# Patient Record
Sex: Male | Born: 1965 | Race: White | Hispanic: No | Marital: Married | Smoking: Current every day smoker
Health system: Southern US, Community
[De-identification: ages and names within clinical notes are randomized; demographics above are authoritative.]

---

## 2003-12-26 ENCOUNTER — Emergency Department: Payer: Self-pay | Admitting: Emergency Medicine

## 2008-03-14 ENCOUNTER — Emergency Department (HOSPITAL_COMMUNITY): Admission: EM | Admit: 2008-03-14 | Discharge: 2008-03-14 | Payer: Self-pay | Admitting: Emergency Medicine

## 2008-03-16 ENCOUNTER — Ambulatory Visit (HOSPITAL_COMMUNITY): Admission: RE | Admit: 2008-03-16 | Discharge: 2008-03-16 | Payer: Self-pay | Admitting: Emergency Medicine

## 2008-04-30 ENCOUNTER — Encounter: Admission: RE | Admit: 2008-04-30 | Discharge: 2008-04-30 | Payer: Self-pay | Admitting: Neurosurgery

## 2008-05-05 ENCOUNTER — Ambulatory Visit: Payer: Self-pay | Admitting: Internal Medicine

## 2008-05-15 ENCOUNTER — Emergency Department: Payer: Self-pay | Admitting: Emergency Medicine

## 2008-05-15 ENCOUNTER — Ambulatory Visit: Payer: Self-pay | Admitting: Internal Medicine

## 2008-06-24 ENCOUNTER — Ambulatory Visit: Payer: Self-pay | Admitting: Specialist

## 2008-07-08 ENCOUNTER — Ambulatory Visit: Payer: Self-pay | Admitting: Family

## 2008-07-25 ENCOUNTER — Ambulatory Visit: Payer: Self-pay | Admitting: Family

## 2008-10-22 ENCOUNTER — Ambulatory Visit: Payer: Self-pay | Admitting: Family

## 2008-11-06 ENCOUNTER — Ambulatory Visit: Payer: Self-pay | Admitting: Gastroenterology

## 2008-11-07 ENCOUNTER — Emergency Department: Payer: Self-pay | Admitting: Emergency Medicine

## 2008-12-26 ENCOUNTER — Emergency Department: Payer: Self-pay | Admitting: Emergency Medicine

## 2009-01-09 ENCOUNTER — Emergency Department: Payer: Self-pay | Admitting: Unknown Physician Specialty

## 2009-03-27 ENCOUNTER — Emergency Department: Payer: Self-pay | Admitting: Emergency Medicine

## 2009-09-17 ENCOUNTER — Ambulatory Visit: Payer: Self-pay | Admitting: Internal Medicine

## 2009-09-17 ENCOUNTER — Emergency Department: Payer: Self-pay | Admitting: Emergency Medicine

## 2009-11-05 ENCOUNTER — Emergency Department: Payer: Self-pay | Admitting: Emergency Medicine

## 2009-11-11 ENCOUNTER — Emergency Department: Payer: Self-pay | Admitting: Emergency Medicine

## 2009-12-06 ENCOUNTER — Emergency Department: Payer: Self-pay | Admitting: Emergency Medicine

## 2009-12-17 ENCOUNTER — Ambulatory Visit: Payer: Self-pay | Admitting: Internal Medicine

## 2010-02-26 ENCOUNTER — Ambulatory Visit: Payer: Self-pay | Admitting: Internal Medicine

## 2010-03-11 ENCOUNTER — Other Ambulatory Visit: Payer: Self-pay | Admitting: Internal Medicine

## 2010-05-08 ENCOUNTER — Ambulatory Visit: Payer: Self-pay | Admitting: Internal Medicine

## 2010-05-22 ENCOUNTER — Emergency Department: Payer: Self-pay | Admitting: Internal Medicine

## 2010-06-12 ENCOUNTER — Ambulatory Visit: Payer: Self-pay | Admitting: Internal Medicine

## 2010-06-23 ENCOUNTER — Ambulatory Visit: Payer: Self-pay | Admitting: Internal Medicine

## 2010-07-01 ENCOUNTER — Encounter (HOSPITAL_COMMUNITY)
Admission: RE | Admit: 2010-07-01 | Discharge: 2010-07-01 | Disposition: A | Discharge status: Home / Self Care | Payer: Medicaid Other | Source: Ambulatory Visit | Attending: Neurosurgery | Admitting: Neurosurgery

## 2010-07-01 LAB — COMPREHENSIVE METABOLIC PANEL
BUN: 6 mg/dL (ref 6–23)
CO2: 26 mEq/L (ref 19–32)
Calcium: 9.7 mg/dL (ref 8.4–10.5)
Chloride: 106 mEq/L (ref 96–112)
Creatinine, Ser: 0.89 mg/dL (ref 0.4–1.5)
GFR calc non Af Amer: >60 mL/min (ref >60)
Glucose, Bld: 96 mg/dL (ref 70–99)
Total Bilirubin: 0.5 mg/dL (ref 0.3–1.2)

## 2010-07-01 LAB — SURGICAL PCR SCREEN
MRSA, PCR: NEGATIVE
Staphylococcus aureus: NEGATIVE

## 2010-07-01 LAB — CBC
Hemoglobin: 15.4 g/dL (ref 13.0–17.0)
MCH: 32.8 pg (ref 26.0–34.0)
MCHC: 34.5 g/dL (ref 30.0–36.0)
MCV: 95.3 fL (ref 78.0–100.0)
RBC: 4.69 MIL/uL (ref 4.22–5.81)

## 2010-07-03 ENCOUNTER — Observation Stay (HOSPITAL_COMMUNITY): Payer: Medicaid Other

## 2010-07-03 ENCOUNTER — Observation Stay (HOSPITAL_COMMUNITY)
Admission: RE | Admit: 2010-07-03 | Discharge: 2010-07-04 | Disposition: A | Discharge status: Home / Self Care | Payer: Medicaid Other | Source: Ambulatory Visit | Attending: Neurosurgery | Admitting: Neurosurgery

## 2010-07-03 DIAGNOSIS — K219 Gastro-esophageal reflux disease without esophagitis: Secondary | ICD-10-CM | POA: Insufficient documentation

## 2010-07-03 DIAGNOSIS — F17200 Nicotine dependence, unspecified, uncomplicated: Secondary | ICD-10-CM | POA: Insufficient documentation

## 2010-07-03 DIAGNOSIS — M5020 Other cervical disc displacement, unspecified cervical region: Principal | ICD-10-CM | POA: Insufficient documentation

## 2010-07-03 DIAGNOSIS — Z01812 Encounter for preprocedural laboratory examination: Secondary | ICD-10-CM | POA: Insufficient documentation

## 2010-07-07 NOTE — Op Note (Signed)
Ricardo Butler, Ricardo Butler NO.:  [id number]  MEDICAL RECORD NO.:  [id number]           PATIENT TYPE:  O  LOCATION:  3534                         FACILITY:  [facility]  PHYSICIAN:  Reinaldo Meeker, M.D. DATE OF BIRTH:  05/29/1965  DATE OF PROCEDURE:  07/03/2010 DATE OF DISCHARGE:                              OPERATIVE REPORT   PREOPERATIVE DIAGNOSIS:  Herniated disk, C3-4, right.  POSTOPERATIVE DIAGNOSIS:  Herniated disk, C3-4, right.  PROCEDURE:  C3-4 anterior cervical diskectomy with trabecular metal interbody fusion, followed by Trinica anterior cervical plating.  SURGEON:  Reinaldo Meeker, MD  ASSISTANT:  Kathaleen Maser. Pool, MD  PROCEDURE IN DETAIL:  After placed in the supine position and 5 pounds Halter traction, the patient's neck was prepped and draped in usual sterile fashion.  Localizing x-rays were taken prior to incision to identify the appropriate level.  Transverse incision was made in the right anterior neck starting in the midline, headed towards the medial aspect of the sternocleidomastoid muscle.  The platysma muscle was then incised transversely.  The natural fascial plane between the strap muscles medially and sternocleidomastoid laterally was identified and followed down to the anterior aspect of cervical spine.  Longus colli muscles were identified, split in the midline, stripped away bilaterally with Kittner dissector and unipolar coagulation.  Self-retaining retractor was placed for exposure and X-rays showed approach to be at the appropriate level.  Using a 15 blade, the herniated disk at C3-4 was incised.  Using pituitary rongeurs and curettes, approximately 90% disk material was removed.  High-speed drill was used to widen the interspace and bony shavings were saved for use later in the case.  Microscope was draped, brought in the field, used for the remainder of the case.  Using microsection technique, the remainder of the disk material on  the posterior longitudinal ligament was removed.  Wound was then incised transversely and the cut edge was removed with the Kerrison punch. Thorough decompression was carried out on the spinal dura and towards the left, asymptomatic side.  Attention was then turned towards the right symptomatic side.  Large amounts of herniated disk material was identified pushing on the lateral dura and nerve root within the foramen.  This was thoroughly removed until we were able to decompress and visualize the underlying nerve root.  At this time, inspection was carried out at this level for any evidence of residual compression, none could be identified.  Large amounts of irrigation was carried out and any bleeding was controlled with bipolar cautery by coagulation and Gelfoam.  Measurements were taken and a 7-mm trabecular metal spacer was chosen.  It was then packed with mixture of EquivaBone and autologous bone and after hemostasis was confirmed, the plug was impacted without difficulty.  Fluoroscopy showed it to be in good position.  Appropriate length from the anterior cervical plate was then chosen.  Under fluoroscopic guidance, drill holes were placed followed by placing 14-mm screws x4.  When these were all in, the locking mechanism was rotated into the locked position.  Fluoroscopy showed good placement of the plate, screws, and plug.  Large  amounts of irrigation was carried out. Any bleeding was controlled with bipolar coagulation.  The wound was then closed with inverted Vicryl in the muscle, 5-0 PDS in the subcuticular, and Steri-Strips on the skin. Sterile dressing and soft collar applied, and the patient was extubated and taken to recovery room in stable condition.          ______________________________ Reinaldo Meeker, M.D.     ROK/MEDQ  D:  07/03/2010  T:  07/04/2010  Job:  [id number]  Electronically Signed by Aliene Beams M.D. on 07/07/2010 10:57:19 AM

## 2010-08-11 ENCOUNTER — Other Ambulatory Visit: Payer: Self-pay | Admitting: Neurosurgery

## 2010-08-11 ENCOUNTER — Ambulatory Visit
Admission: RE | Admit: 2010-08-11 | Discharge: 2010-08-11 | Disposition: A | Discharge status: Home / Self Care | Payer: Medicaid Other | Source: Ambulatory Visit | Attending: Neurosurgery | Admitting: Neurosurgery

## 2010-08-11 DIAGNOSIS — M5020 Other cervical disc displacement, unspecified cervical region: Secondary | ICD-10-CM

## 2010-08-30 ENCOUNTER — Observation Stay: Payer: Self-pay | Admitting: Internal Medicine

## 2010-09-18 ENCOUNTER — Ambulatory Visit: Payer: Self-pay | Admitting: Internal Medicine

## 2010-09-21 ENCOUNTER — Other Ambulatory Visit: Payer: Self-pay | Admitting: Neurosurgery

## 2010-09-21 DIAGNOSIS — M545 Low back pain: Secondary | ICD-10-CM

## 2010-09-30 ENCOUNTER — Ambulatory Visit
Admission: RE | Admit: 2010-09-30 | Discharge: 2010-09-30 | Disposition: A | Discharge status: Home / Self Care | Payer: Medicaid Other | Source: Ambulatory Visit | Attending: Neurosurgery | Admitting: Neurosurgery

## 2010-09-30 DIAGNOSIS — M545 Low back pain: Secondary | ICD-10-CM

## 2010-09-30 MED ORDER — GADOBENATE DIMEGLUMINE 529 MG/ML IV SOLN
19.0000 mL | Freq: Once | INTRAVENOUS | Status: AC | PRN
  Administered 2010-09-30: 19 mL via INTRAVENOUS

## 2010-10-02 ENCOUNTER — Emergency Department: Payer: Self-pay | Admitting: Emergency Medicine

## 2010-11-08 ENCOUNTER — Emergency Department: Payer: Self-pay | Admitting: Emergency Medicine

## 2010-11-18 ENCOUNTER — Other Ambulatory Visit: Payer: Self-pay | Admitting: *Deleted

## 2010-11-18 MED ORDER — TRAMADOL HCL 50 MG PO TABS: 50.0000 mg | ORAL_TABLET | Freq: Four times a day (QID) | ORAL | Status: AC | PRN

## 2010-11-19 ENCOUNTER — Ambulatory Visit: Payer: Self-pay | Admitting: Family Medicine

## 2010-11-23 ENCOUNTER — Ambulatory Visit: Payer: Self-pay | Admitting: Family Medicine

## 2010-12-26 ENCOUNTER — Other Ambulatory Visit: Payer: Self-pay | Admitting: Internal Medicine

## 2010-12-28 ENCOUNTER — Ambulatory Visit: Payer: Self-pay

## 2011-01-12 ENCOUNTER — Other Ambulatory Visit: Payer: Self-pay | Admitting: Internal Medicine

## 2011-01-12 MED ORDER — LORAZEPAM 0.5 MG PO TABS: 0.5000 mg | ORAL_TABLET | Freq: Every day | ORAL | Status: AC | PRN

## 2011-03-30 ENCOUNTER — Other Ambulatory Visit: Payer: Self-pay | Admitting: Internal Medicine

## 2011-03-31 ENCOUNTER — Telehealth: Payer: Self-pay | Admitting: *Deleted

## 2011-03-31 NOTE — Telephone Encounter (Signed)
Pharm faxed RF request for Lyrica 100 mg 1 tid. OK?

## 2011-04-01 MED ORDER — PREGABALIN 100 MG PO CAPS: 100.0000 mg | ORAL_CAPSULE | Freq: Three times a day (TID) | ORAL | Status: DC

## 2011-04-01 NOTE — Telephone Encounter (Signed)
Rx called to pharmacy

## 2011-04-01 NOTE — Telephone Encounter (Signed)
ok 

## 2011-04-12 ENCOUNTER — Telehealth: Payer: Self-pay | Admitting: *Deleted

## 2011-04-12 NOTE — Telephone Encounter (Signed)
Prior auth needed for singulair,

## 2011-04-12 NOTE — Telephone Encounter (Signed)
Received notice from [facility] in [place] that prior auth is needed for singulair.  Because pt's medicaid card lists [facility], advised pharmacy that pt with have to go through that office.

## 2011-07-19 ENCOUNTER — Other Ambulatory Visit: Payer: Self-pay | Admitting: *Deleted

## 2011-07-19 MED ORDER — PREGABALIN 100 MG PO CAPS: 100.0000 mg | ORAL_CAPSULE | Freq: Three times a day (TID) | ORAL | Status: DC

## 2011-07-19 NOTE — Telephone Encounter (Signed)
Lyrica request [last refill 01.17.13 #[id number]] Faxed to pharmacy w/notation prior OV needed for future refills/SLS

## 2011-12-08 ENCOUNTER — Emergency Department: Payer: Self-pay | Admitting: Emergency Medicine

## 2012-01-24 ENCOUNTER — Ambulatory Visit: Payer: Self-pay | Admitting: Internal Medicine

## 2012-05-10 ENCOUNTER — Other Ambulatory Visit: Payer: Self-pay | Admitting: Internal Medicine

## 2012-05-10 NOTE — Telephone Encounter (Signed)
Denied, patient has not been seen here at all and i have been her 1.5 year.

## 2012-07-27 IMAGING — CT CT CHEST-ABD W/O CM
1 of 2 series · 14 of 32 positions shown, 19 images · non-contrast
Comparison: none

REASON FOR EXAM: fall, right sided chest wall pain, IVP Dye allergy
COMMENTS:

PROCEDURE:     CT  - CT CHEST / ABDOMEN WITHOUT  - November 06, 2009  [DATE]
RESULT:
TECHNIQUE: Helical 5 mm sections were obtained from the thoracic inlet
through the superior iliac crest without administration of oral or
intravenous contrast.

[Series 2: soft tissue · axial · 0.71mm/px · z∈[-418,+12]mm · 14 of 98 slices shown, 19 images]
[im 6/98  soft-tissue]
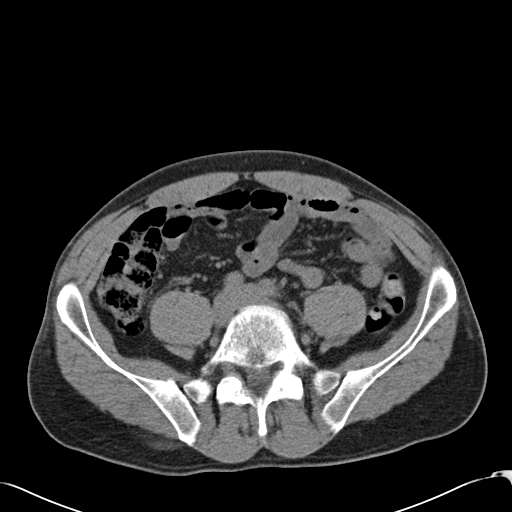
[im 6/98  bone]
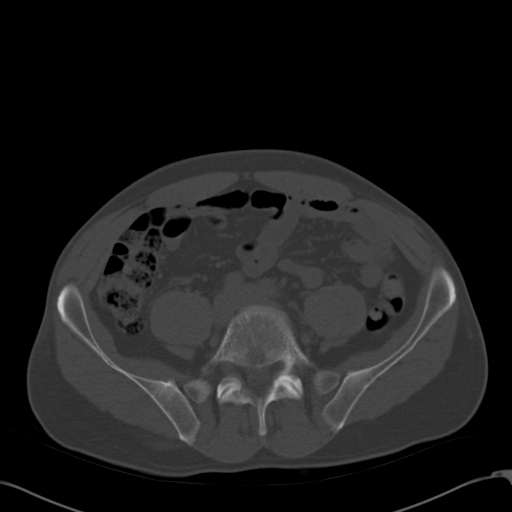
[im 16/98  soft-tissue]
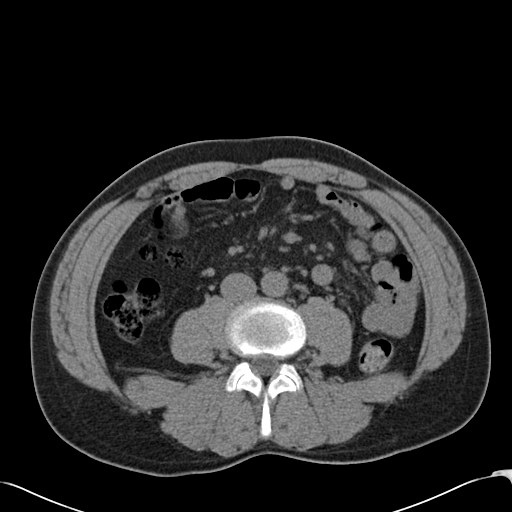
[im 21/98  soft-tissue]
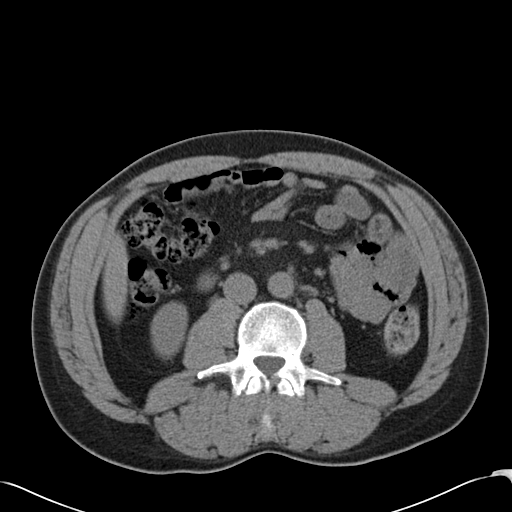
[im 26/98  soft-tissue]
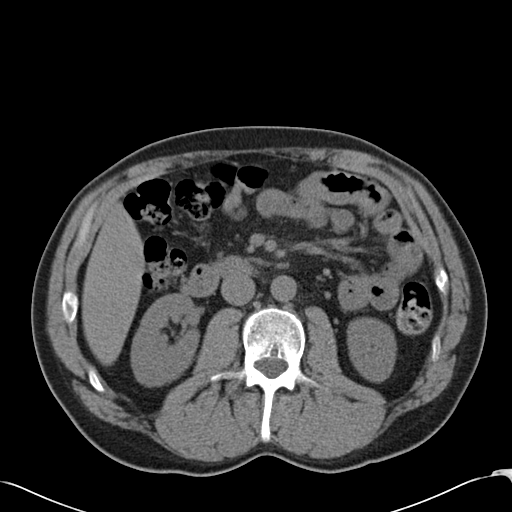
[im 36/98  soft-tissue]
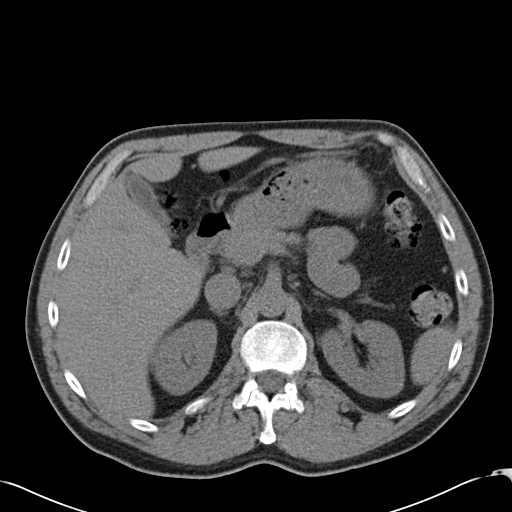
[im 41/98  soft-tissue]
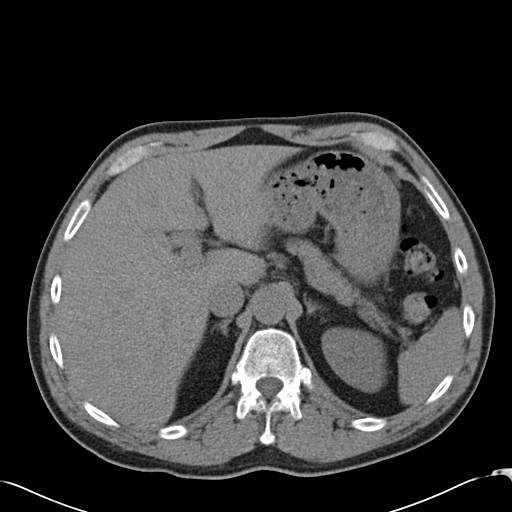
[im 52/98  soft-tissue]
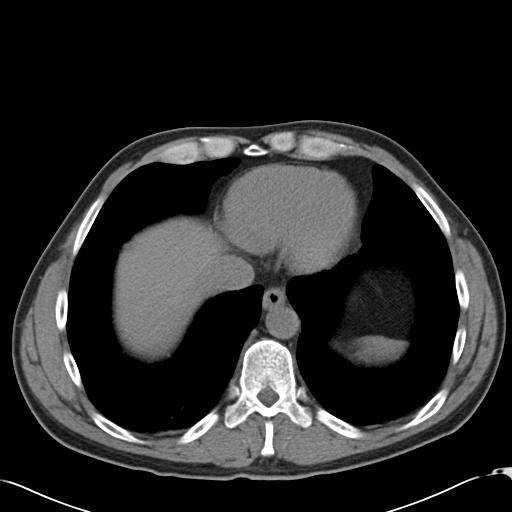
[im 57/98  soft-tissue]
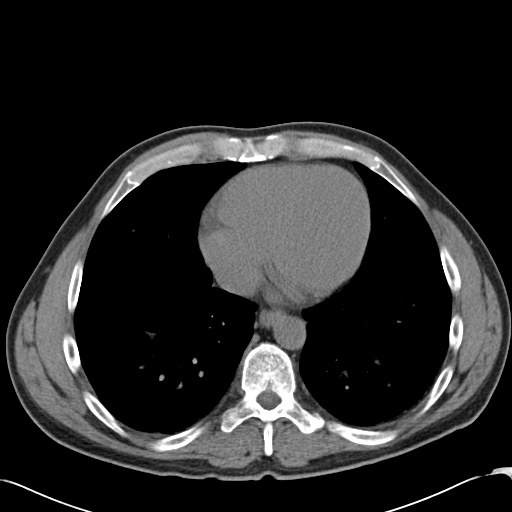
[im 62/98  soft-tissue]
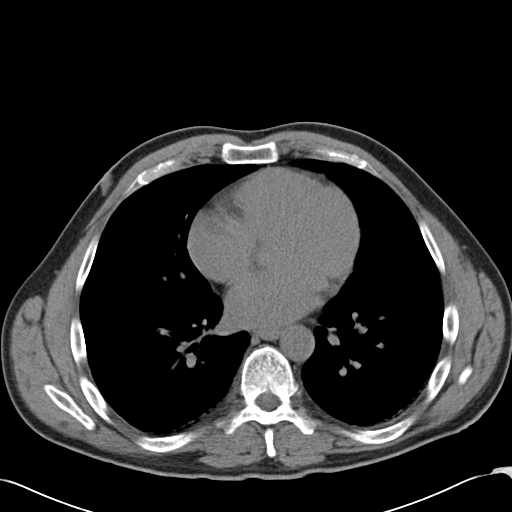
[im 62/98  bone]
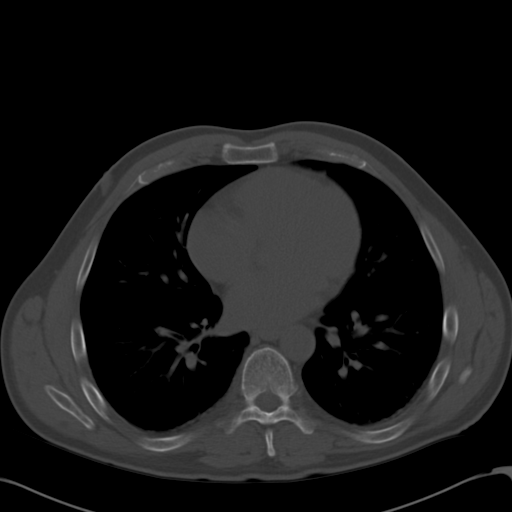
[im 72/98  soft-tissue]
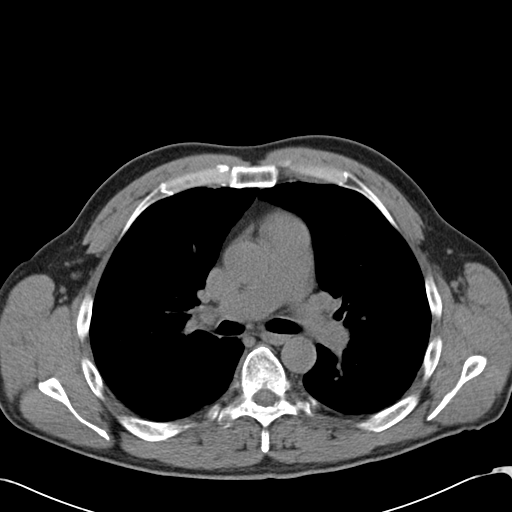
[im 77/98  soft-tissue]
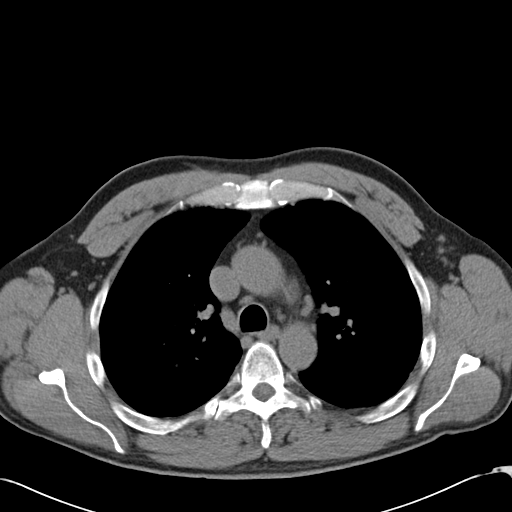
[im 77/98  lung]
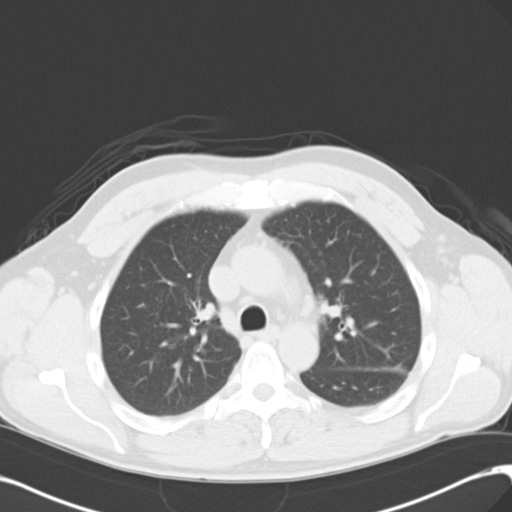
[im 82/98  soft-tissue]
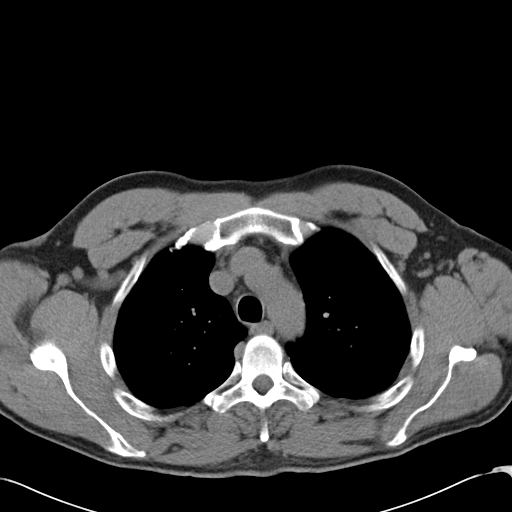
[im 82/98  lung]
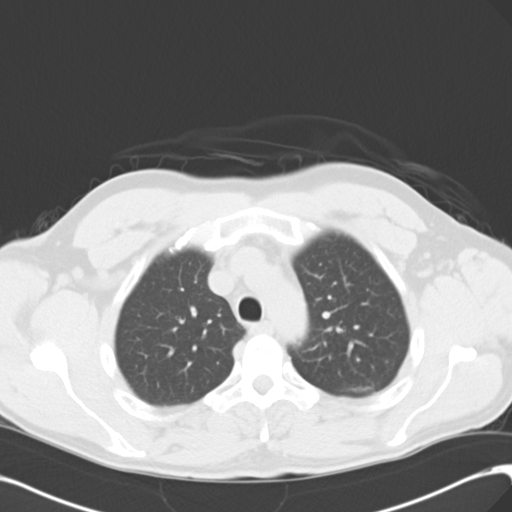
[im 87/98  lung]
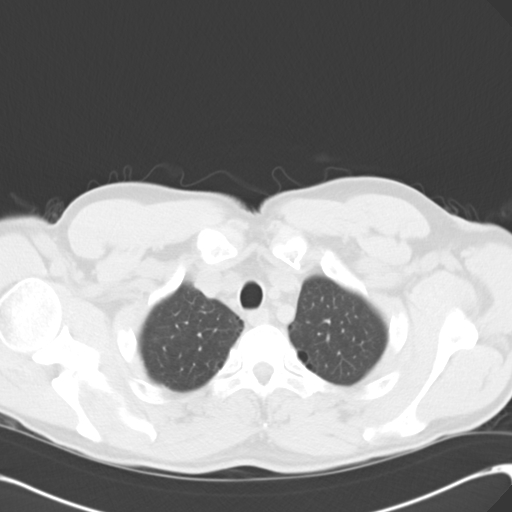
[im 92/98  soft-tissue]
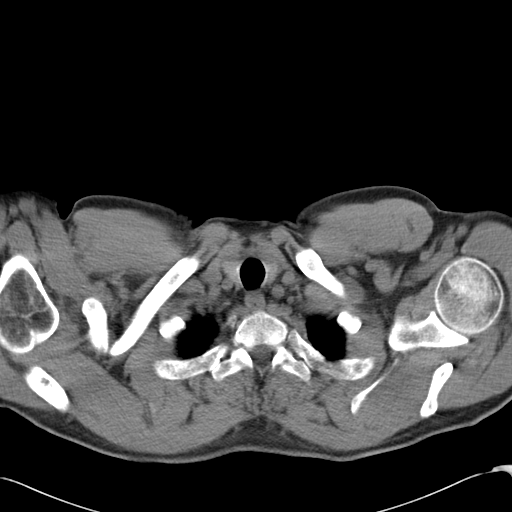
[im 92/98  lung]
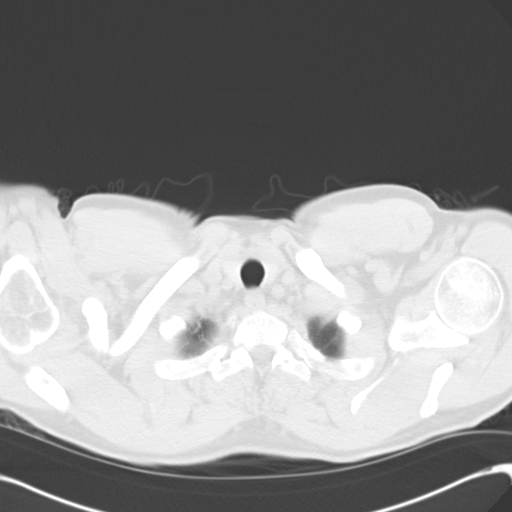

[14 of 32 positions shown; findings below may reference images not displayed]

FINDINGS: Evaluation of the mediastinum and hilar regions and structures
demonstrates no evidence to suggest the sequelae of a mediastinal hematoma.
There is no gross evidence of masses or adenopathy. There is no gross
evidence of masses or adenopathy. The lung parenchyma demonstrates no
evidence of a pneumothorax, pulmonary masses or nodules, effusions or edema.
The osseous structures demonstrate no evidence of fracture or dislocation.
Evaluation of the abdomen demonstrates no CT evidence of solid organ
visceral injury. Noncontrast evaluation of the liver, spleen adrenals,
pancreas and kidneys is unremarkable. There is no evidence of abdominal free
fluid, loculated fluid collections, masses or adenopathy. There is no
evidence of bowel obstruction.
IMPRESSION: 1.     No CT evidence reflecting the sequelae of intrathoracic or
intra-abdominal injury.
2.     Dr. Locklear of the Emergency Department was informed of these
findings via a preliminary faxed report.

## 2012-09-06 IMAGING — US US PELVIS LIMITED
1 series · 17 of 25 positions shown · non-contrast
Comparison: none

REASON FOR EXAM: testicular pain swelling
COMMENTS:

[Series 1: us pelvis limited · 17 of 69 slices shown]
[im 1/69]
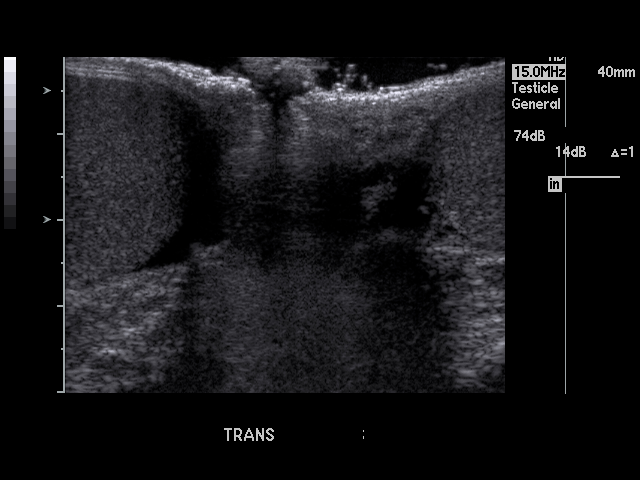
[im 6/69]
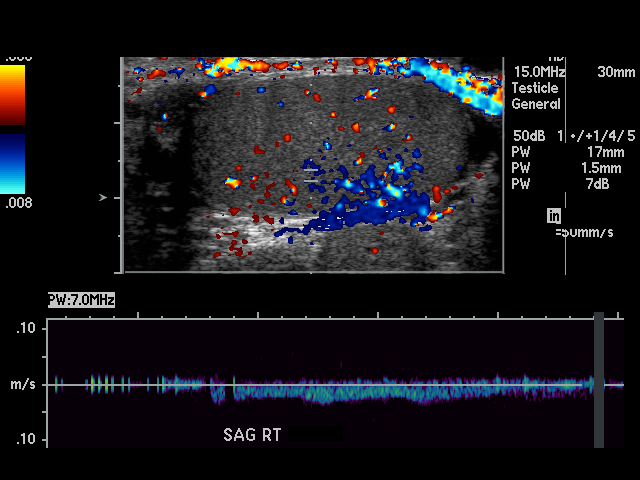
[im 9/69]
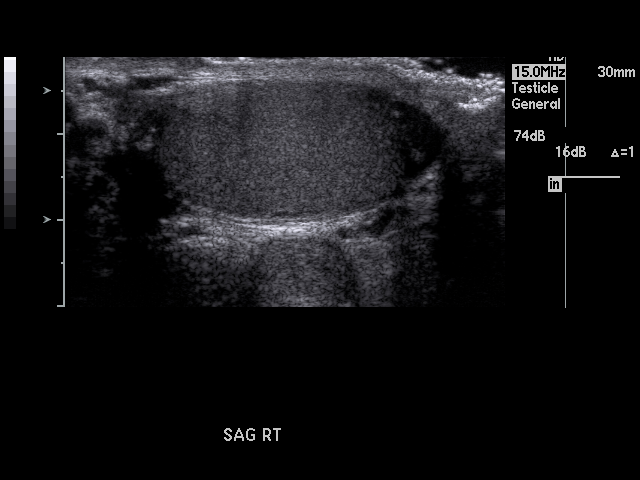
[im 15/69]
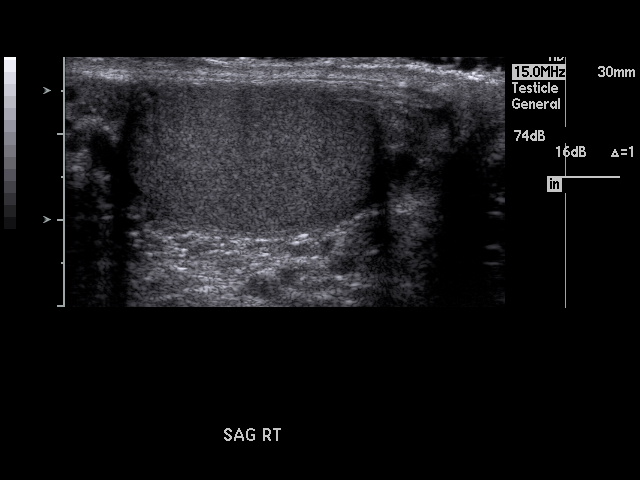
[im 18/69]
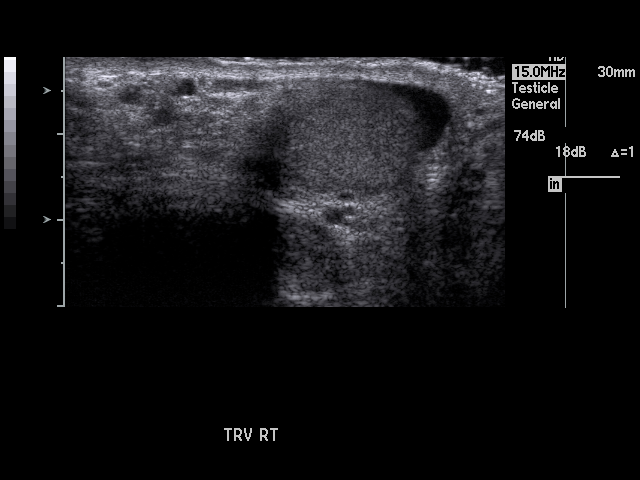
[im 23/69]
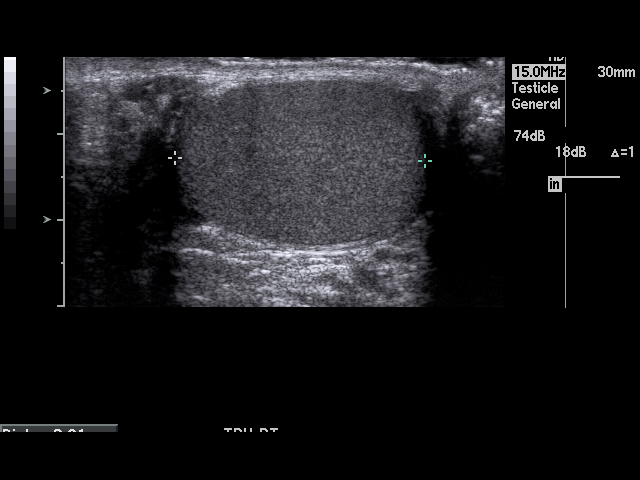
[im 26/69]
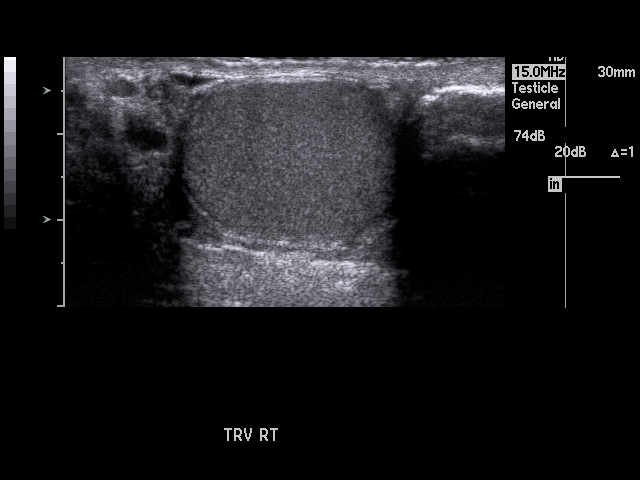
[im 32/69]
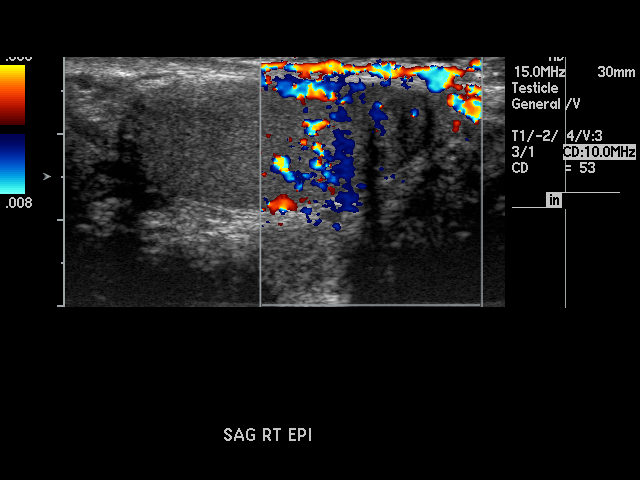
[im 35/69]
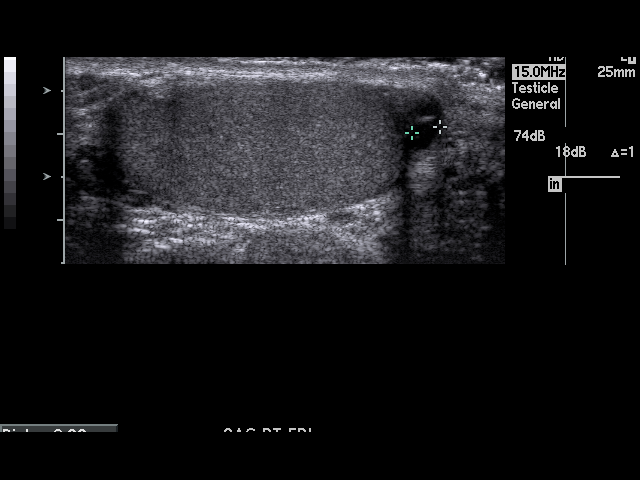
[im 37/69]
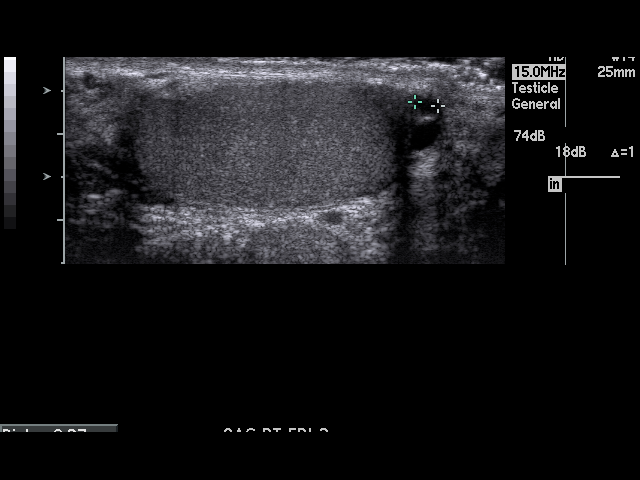
[im 43/69]
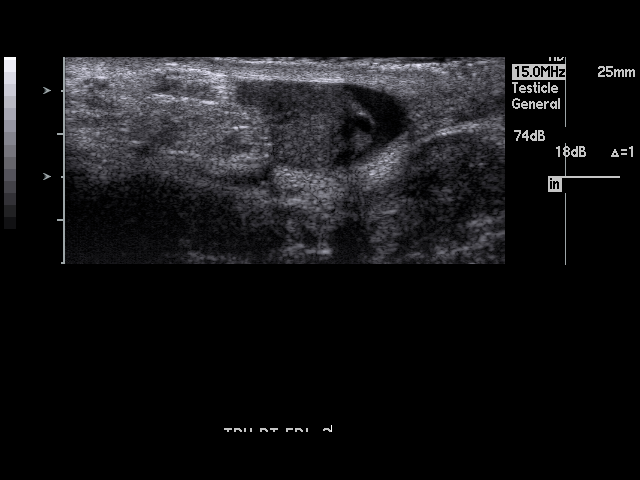
[im 46/69]
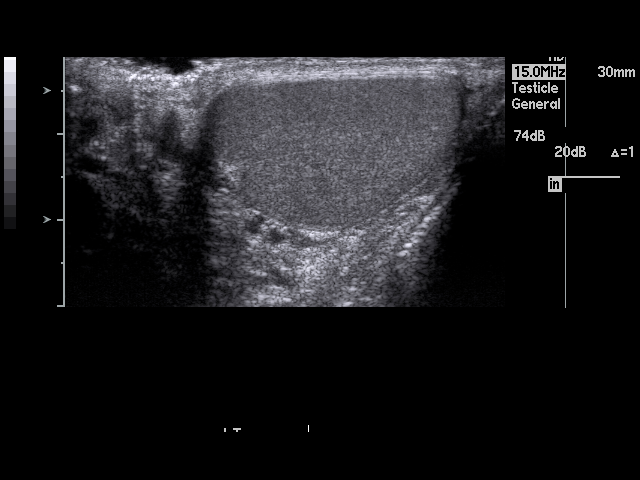
[im 52/69]
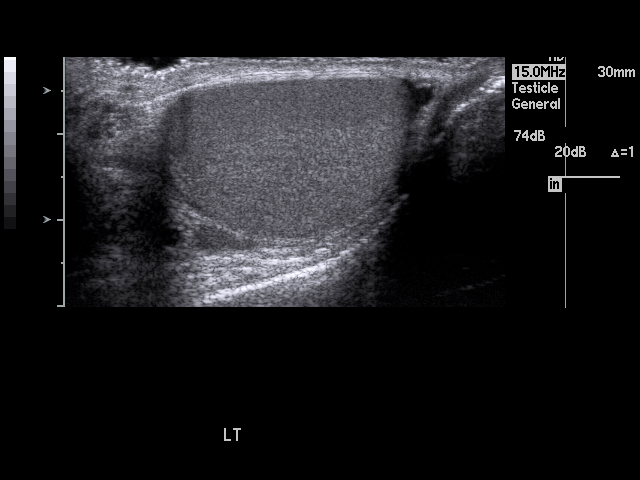
[im 54/69]
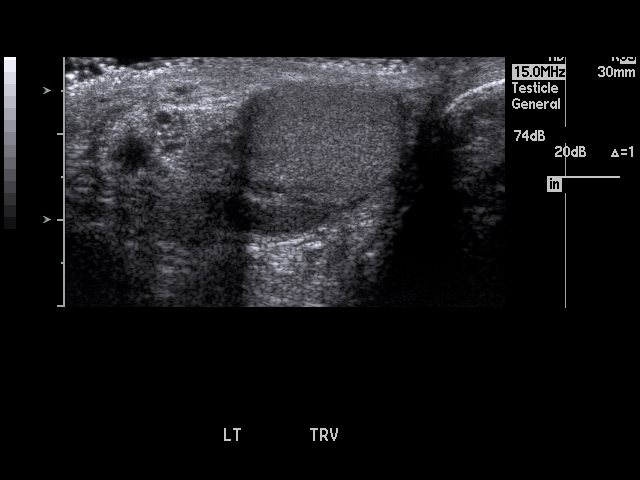
[im 60/69]
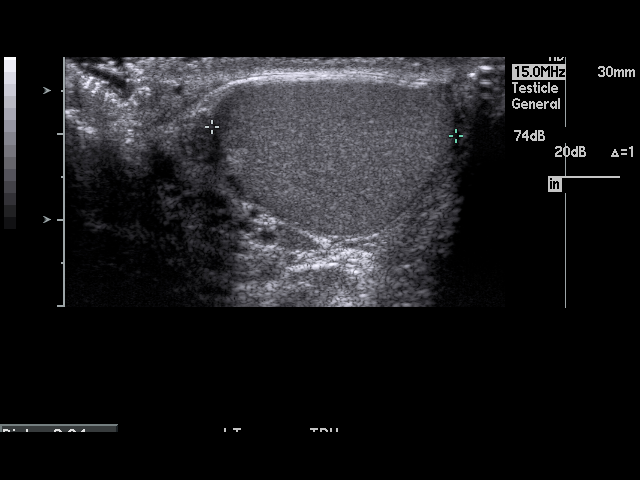
[im 63/69]
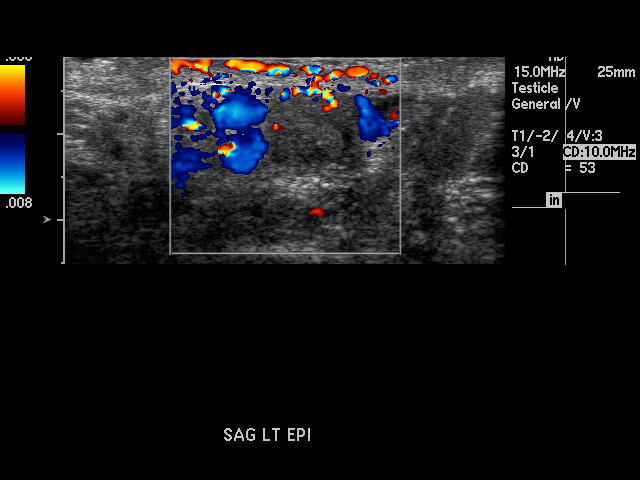
[im 69/69]
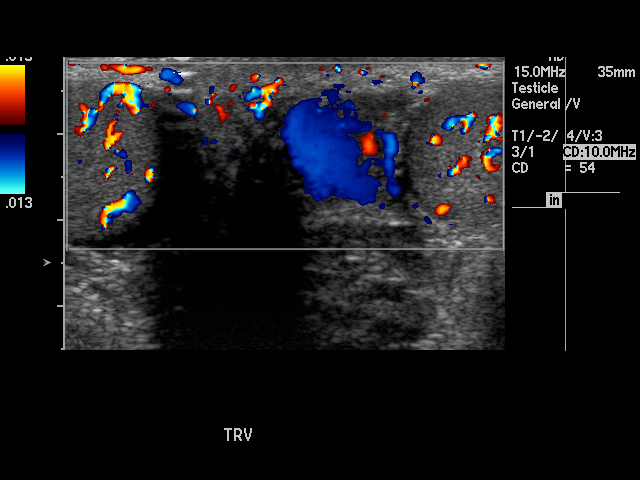

[17 of 25 positions shown; findings below may reference images not displayed]

PROCEDURE:     FERREYRA - FERREYRA TESTICULAR  - December 17, 2009  [DATE]

RESULT:     The right testicle measures 3.85 cm x 2.91 cm x 2.01 cm and the
left testicle measures 3.02 cm x 2.84 cm x 1.81 cm. There is observed
symmetrical vascular flow in each testicle. No torsion is seen. No
intratesticular mass lesions are seen. The epididymis is normal in size.
There are incidentally noted two, tiny, right epididymal cysts with the
larger being 5.3 mm in diameter and the smaller 2.4 mm in diameter. There is
a tiny hydrocele on the right.
IMPRESSION: 1.  No testicular mass lesion is identified.
3.  No torsion is identified.
3.  A few, tiny epididymal cysts are noted.
4.  There is a tiny, right hydrocele.

## 2012-11-26 ENCOUNTER — Emergency Department: Payer: Self-pay | Admitting: Emergency Medicine

## 2012-11-26 LAB — CBC WITH DIFFERENTIAL/PLATELET
Eosinophil #: 0 10*3/uL (ref 0.0–0.7)
HGB: 12.1 g/dL — ABNORMAL LOW (ref 13.0–18.0)
Lymphocyte #: 1.4 10*3/uL (ref 1.0–3.6)
MCH: 33.4 pg (ref 26.0–34.0)
MCHC: 34.8 g/dL (ref 32.0–36.0)
MCV: 96 fL (ref 80–100)
Monocyte #: 1.2 x10 3/mm — ABNORMAL HIGH (ref 0.2–1.0)
Neutrophil #: 9.9 10*3/uL — ABNORMAL HIGH (ref 1.4–6.5)
Platelet: 274 10*3/uL (ref 150–440)
WBC: 12.6 10*3/uL — ABNORMAL HIGH (ref 3.8–10.6)

## 2012-11-26 LAB — DRUG SCREEN, URINE
Amphetamines, Ur Screen: NEGATIVE (ref ?–1000)
Cannabinoid 50 Ng, Ur ~~LOC~~: NEGATIVE (ref ?–50)
Cocaine Metabolite,Ur ~~LOC~~: NEGATIVE (ref ?–300)
MDMA (Ecstasy)Ur Screen: NEGATIVE (ref ?–500)
Opiate, Ur Screen: NEGATIVE (ref ?–300)
Tricyclic, Ur Screen: NEGATIVE (ref ?–1000)

## 2012-11-26 LAB — URINALYSIS, COMPLETE
Bacteria: NONE SEEN
Leukocyte Esterase: NEGATIVE
Nitrite: NEGATIVE
Protein: NEGATIVE
RBC,UR: 1 /HPF (ref 0–5)
WBC UR: <1 /HPF (ref 0–5)

## 2012-11-26 LAB — COMPREHENSIVE METABOLIC PANEL
Albumin: 3.1 g/dL — ABNORMAL LOW (ref 3.4–5.0)
Alkaline Phosphatase: 104 U/L (ref 50–136)
Bilirubin,Total: 1.4 mg/dL — ABNORMAL HIGH (ref 0.2–1.0)
Calcium, Total: 9 mg/dL (ref 8.5–10.1)
SGPT (ALT): 21 U/L (ref 12–78)
Total Protein: 7.1 g/dL (ref 6.4–8.2)

## 2012-11-26 LAB — RAPID HIV-1/2 QL/CONFIRM: HIV-1/2,Rapid Ql: NEGATIVE

## 2012-11-26 LAB — LACTATE DEHYDROGENASE: LDH: 249 U/L — ABNORMAL HIGH (ref 85–241)

## 2012-11-26 LAB — TROPONIN I: Troponin-I: <0.02 ng/mL

## 2012-12-01 LAB — CULTURE, BLOOD (SINGLE)

## 2013-08-20 ENCOUNTER — Ambulatory Visit: Payer: Self-pay | Admitting: Urology

## 2013-08-20 LAB — DRUG SCREEN, URINE
AMPHETAMINES, UR SCREEN: NEGATIVE (ref ?–1000)
BARBITURATES, UR SCREEN: NEGATIVE (ref ?–200)
Benzodiazepine, Ur Scrn: NEGATIVE (ref ?–200)
Cannabinoid 50 Ng, Ur ~~LOC~~: NEGATIVE (ref ?–50)
Cocaine Metabolite,Ur ~~LOC~~: NEGATIVE (ref ?–300)
MDMA (Ecstasy)Ur Screen: NEGATIVE (ref ?–500)
METHADONE, UR SCREEN: NEGATIVE (ref ?–300)
Opiate, Ur Screen: POSITIVE (ref ?–300)
PHENCYCLIDINE (PCP) UR S: NEGATIVE (ref ?–25)
TRICYCLIC, UR SCREEN: NEGATIVE (ref ?–1000)

## 2013-08-20 LAB — URINALYSIS, COMPLETE
Bacteria: NONE SEEN
Bilirubin,UR: NEGATIVE
Blood: NEGATIVE
Glucose,UR: NEGATIVE mg/dL (ref 0–75)
KETONE: NEGATIVE
LEUKOCYTE ESTERASE: NEGATIVE
Nitrite: NEGATIVE
PH: 6 (ref 4.5–8.0)
Protein: NEGATIVE
RBC,UR: NONE SEEN /HPF (ref 0–5)
SQUAMOUS EPITHELIAL: NONE SEEN
Specific Gravity: 1.002 (ref 1.003–1.030)
WBC UR: <1 /HPF (ref 0–5)

## 2014-07-05 NOTE — Consult Note (Signed)
PATIENT NAME:  Ricardo Butler, Ricardo Butler MR#:  [id number] DATE OF BIRTH:  [birth year]  DATE OF CONSULTATION:  11/26/2012  CONSULTING PHYSICIAN:  Shatira Dobosz R. Notnamed Scholz, MD  PRIMARY CARE PHYSICIAN:  Dr. Bari Edward    CHIEF COMPLAINT:  Shortness of breath, myalgias and fever.   HISTORY OF PRESENT ILLNESS:  A 49 year old male patient with history of tobacco abuse, chronic back pain, presents to the hospital complaining of 3 days of shortness of breath, cough and wheezing. The patient also complains of generalized myalgias. Has not had any rash. Mentions he has had a fever of 101 at home, although afebrile here in the Emergency Room. He also had an episode of vomiting yesterday. No diarrhea, or blood in his vomiting or stool. He continues to smoke 2 to 3 packs a day.   Here in the Emergency Room, the patient had a chest x-ray done, which showed bilateral infiltrates. A D-dimer was elevated, and a CT scan of the chest was done, which showed no pulmonary embolism, but does show bilateral infiltrates with changes of COPD. I have been consulted for pneumonia by Dr. Darnelle Catalan of ER.   PAST MEDICAL HISTORY: 1.  Tobacco abuse.  2.  Chronic back pain.   FAMILY HISTORY:  Coronary artery disease.   SOCIAL HISTORY:  The patient smokes 3 packs a day. Occasional alcohol. No illicit drugs. He mentions that he has used marijuana in the past, but not anymore. Lives at home with wife and 2 kids.   CODE STATUS:  FULL CODE.   ALLERGIES:  IVP DYE.   HOME MEDICATIONS INCLUDE: 1.  Baclofen orally once a day.  3.  Lyrica 100 mg oral 2 times a day.  4.  Omeprazole 20 mg 2 capsules orally once a day.  5.  Oxycodone 10 mg 2 tablets oral every 6 hours as needed for pain.   REVIEW OF SYSTEMS:    CONSTITUTIONAL:  Complains of fever, fatigue, weakness.  EYES:  No blurred vision, pain or redness.  ENT:  No tinnitus, ear pain, hearing loss.  RESPIRATORY:  Has dry cough, wheezing, shortness of breath.  CARDIOVASCULAR:  No chest  pain, orthopnea, edema.  GASTROINTESTINAL:  No nausea, vomiting, diarrhea, abdominal pain.  GENITOURINARY:  No dysuria, hematuria, frequency.  ENDOCRINE:  No polyuria, nocturia, thyroid problems.  HEMATOLOGIC AND LYMPHATIC:  No anemia, easy bruising, bleeding.  INTEGUMENTARY:  Denies rash, lesions.  MUSCULOSKELETAL:  Has generalized myalgias, has chronic back pain.  NEUROLOGIC:  No focal numbness, weakness, seizures.  PSYCHIATRIC:  No anxiety or depression.   PHYSICAL EXAMINATION: VITAL SIGNS:  Temperature 98.3, pulse of 75, blood pressure 116/67, saturating 97% on room air.  GENERAL: Moderately built Caucasian male patient lying in bed, comfortable, conversational, cooperative with exam. Coughs occasionally.  PSYCHIATRIC:  Alert and oriented x 3. Mood and affect appropriate. Judgment intact.  HEENT: Atraumatic, normocephalic. Oral mucosa dry and pink. External ears and nose normal. No pallor. No icterus. Pupils bilaterally equal and react to light.  NECK: Supple. No thyromegaly. No palpable lymph nodes. Trachea midline. No carotid bruits, JVD.  CARDIOVASCULAR:  S1, S2, without any murmurs. Peripheral pulses 2+. No edema.  RESPIRATORY:  Has mild wheezing with expiration. No crackles. Normal work of breathing. Not using accessory muscles.  GASTROINTESTINAL: Soft abdomen, nontender. Bowel sounds present. No hepatosplenomegaly palpable.  SKIN:  Warm and dry. No petechiae, rash, ulcers.  GENITOURINARY:  No CVA tenderness or bladder distention.  MUSCULOSKELETAL:  No joint swelling, redness, effusion of the large joints.  Normal muscle tone.  NEUROLOGIC:  Motor strength 5/5 in upper and lower extremities.  LYMPHATIC:  No cervical lymphadenopathy.   LAB STUDIES:  Show glucose of 99, BNP 201, BUN 5, creatinine 0.99, sodium 132, potassium 4.1 GFR greater than 60. AST, ALT, alkaline phosphatase normal. Troponin less than 0.02. WBC 12.6, hemoglobin 12.1, with neutrophils of 78%, platelets 274, no bands.  D-dimer of 1. Influenza negative. Urinalysis shows no bacteria.   Chest x-ray showed bilateral infiltrates.   CT of the chest showed bilateral infiltrates without any pulmonary embolism.   EKG showed normal sinus rhythm. No acute ST-T wave changes.   ASSESSMENT AND PLAN: 1.  Bilateral infiltrates in a patient with no risk factors for any multidrug-resistant organisms. Considering the bilateral infiltrates, I will check a HIV test on the patient. I advised him to check on these results. These will be forwarded to PCP when available. He does mention he had an HIV test done 5 months back, which was negative. He does not have any fever, no tachycardia, hypoxia. Blood pressure is in the normal range. White count is only mildly elevated, and he is afebrile. I will start the patient on Levaquin orally for 8 days, along with some prednisone for his wheezing. Albuterol inhaler. I have counseled the patient to quit smoking for greater than 3 minutes. I have given him a prescription for nicotine patch. Blood cultures are pending at this time, but no signs of sepsis. I advised the patient to follow up with his primary care physician within a week and a repeat chest x-ray for clearing. Discussed with Dr. Dorothea GlassmanPaul Malinda of ER.   2.  Chronic back pain. Continue home medications.   Time spent today on this consult was 60 minutes.    ____________________________ Molinda BailiffSrikar R. Koralyn Prestage, MD srs:mr D: 11/26/2012 17:30:07 ET T: 11/26/2012 18:55:30 ET JOB#: [id number]  cc: Wardell HeathSrikar R. Elpidio AnisSudini, MD, <Dictator> Bari EdwardLaura Berglund, MD  Orie FishermanSRIKAR R Parrish Daddario MD ELECTRONICALLY SIGNED 11/26/2012 19:18

## 2014-07-06 NOTE — Op Note (Signed)
PATIENT NAME:  Ricardo Butler, Ricardo Butler MR#:  [id number] DATE OF BIRTH:  Mar 29, 1965  DATE OF PROCEDURE:  08/20/2013  PREOPERATIVE DIAGNOSIS: Fractured penis.  POSTOPERATIVE DIAGNOSIS: Fractured penis.  PROCEDURES PERFORMED: 1.  Surgical repair of ruptured left corpus cavernosum.  2.  Penile block.   SURGEON: Anola GurneyMichael Wolff, M.D.   ANESTHETISTS: Clarene DukeBhandari and Wolff.   ANESTHETIC METHOD: General per Ronalee BeltsBhandari and local per Dr. Evelene CroonWolff, penile block per Dr. Evelene CroonWolff.   INDICATIONS: See the history and physical. After informed consent, the patient requested the above procedure.   OPERATIVE SUMMARY: After adequate general anesthesia had been obtained, the perineum was prepped and draped in the usual fashion. The patient had significant ecchymosis and swelling of the penis. Penile shaft was tortuous. At this point, a circumcising incision was made and penile skin was dissected back to the base of the penis. A hematoma was evacuated. A ruptured left corpus cavernosum was identified at the mid shaft. Rupture was approximately 1 cm in length and extended longitudinally along the course of the shaft. The urethra was intact. Foley catheter was placed in a sterile fashion. The tunica was then reapproximated with running and locking 3-0 Prolene suture. At this point, a Penrose drain was applied as a tourniquet at the base of the penis. An 18-French butterfly needle was then placed into the corpus cavernosum and an erection was created. No leakage at the repair was noted. Tourniquet was removed. All subcutaneous bleeders were controlled with electrocautery. Penile block was performed with solution of 0.5% Xylocaine and 0.25% Marcaine. The skin edges were then reapproximated with interrupted 3-0 chromic. Sterile dressing was applied. Sponge, needle, and instrument counts were noted to be correct. The procedure was then terminated, and the patient was transferred to the recovery room in stable condition.     ____________________________ [name], [name] D: 08/20/2013 10:05:13 ET T: 08/20/2013 10:27:13 ET JOB#: [id number]  cc: [name], MD, <Dictator> [name] ELECTRONICALLY SIGNED 08/20/2013 12:47

## 2014-08-29 ENCOUNTER — Other Ambulatory Visit: Payer: Self-pay | Admitting: Internal Medicine

## 2017-06-23 ENCOUNTER — Ambulatory Visit: Payer: Self-pay | Admitting: Urology

## 2017-06-23 VITALS — BP 152/86 | HR 63 | Temp 95.8°F | Wt 178.0 lb

## 2017-06-23 DIAGNOSIS — L989 Disorder of the skin and subcutaneous tissue, unspecified: Secondary | ICD-10-CM

## 2017-06-23 NOTE — Progress Notes (Signed)
  Patient: Ricardo Butler Male    DOB: 02/25/1966   52 y.o.   MRN: [id number] Visit Date: 06/23/2017  Today's Provider: [facility]   Chief Complaint  Patient presents with  . Dental Pain   Subjective:    HPI Two years ago he bit into a cracker and three of his teeth chipped.  He is continually have bits of tooth come out over the last two years  Now he has a tooth that the nerve is exposed and hurts all the time.   He has had lesions on the right nasal fold, right inner upper arm and his left ear.  He states these lesions scab over and the scabs would fall off.  Now, the scabs do not return and there is a hole.    He has a history of tendonitis in his hands   Not on File Previous Medications   BACLOFEN (LIORESAL) 10 MG TABLET    TAKE 1 TABLET BY MOUTH FOUR TIMES DAILY AS NEEDED   OMEPRAZOLE (PRILOSEC) 20 MG CAPSULE    TAKE ONE CAPSULE BY MOUTH TWICE DAILY   SINGULAIR 10 MG TABLET    TAKE 1 TABLET BY MOUTH EVERY DAY    Review of Systems  Social History   Tobacco Use  . Smoking status: Current Every Day Smoker    Packs/day: 2.00    Years: 40.00    Pack years: 80.00    Types: Cigarettes  . Smokeless tobacco: Never Used  Substance Use Topics  . Alcohol use: Not on file   Objective:   BP (!) 152/86   Pulse 63   Temp (!) 95.8 F (35.4 C)   Wt 178 lb (80.7 kg)   An area of "punched out" skin on the right nasal fold A circle of 8 mm diameter of dry scaling skin on the right upper arm Actinic keratosis on the left ear  Several molars with missing enamel       Assessment & Plan:     1. Skin lesions Concerning for cancerous lesions are they are persistent and non healing Refer to dermatologist - he has [facility] forms at home  2. Dental clinic Dental caries    [facility]   [facility]

## 2017-06-23 NOTE — Progress Notes (Signed)
   Subjective:    Patient ID: Ricardo Butler, male    DOB: 07/23/1965, 52 y.o.   MRN: [id number]  HPI  Ricardo Butler is a 52 yo male here to establish care with us. He presents with dental pain.    There are no active problems to display for this patient.  Allergies as of 06/23/2017   Not on File     Medication List        Accurate as of 06/23/17  7:29 PM. Always use your most recent med list.          baclofen 10 MG tablet Commonly known as:  LIORESAL TAKE 1 TABLET BY MOUTH FOUR TIMES DAILY AS NEEDED   omeprazole 20 MG capsule Commonly known as:  PRILOSEC TAKE ONE CAPSULE BY MOUTH TWICE DAILY   SINGULAIR 10 MG tablet Generic drug:  montelukast TAKE 1 TABLET BY MOUTH EVERY DAY        Review of Systems     Objective:   Physical Exam  BP (!) 152/86   Pulse 63   Temp (!) 95.8 F (35.4 C)   Wt 178 lb (80.7 kg)        Assessment & Plan:

## 2017-06-24 ENCOUNTER — Telehealth: Payer: Self-pay

## 2017-06-24 NOTE — Telephone Encounter (Signed)
Faxed referral to [facility]

## 2017-06-28 ENCOUNTER — Other Ambulatory Visit: Payer: Self-pay

## 2017-06-28 ENCOUNTER — Telehealth: Payer: Self-pay

## 2017-06-28 DIAGNOSIS — Z0000 Encounter for general adult medical examination without abnormal findings: Secondary | ICD-10-CM

## 2017-06-28 NOTE — Telephone Encounter (Signed)
Cancelled referral to [facility]. Pt has [facility] charity care application at home. Informed pt to fill out and send in as soon as possible. [facility] to [facility].

## 2017-06-29 LAB — COMPREHENSIVE METABOLIC PANEL
ALK PHOS: 72 IU/L (ref 39–117)
ALT: 11 IU/L (ref 0–44)
AST: 25 IU/L (ref 0–40)
Albumin/Globulin Ratio: 1.5 (ref 1.2–2.2)
Albumin: 4.3 g/dL (ref 3.5–5.5)
BILIRUBIN TOTAL: 0.4 mg/dL (ref 0.0–1.2)
BUN/Creatinine Ratio: 9 (ref 9–20)
BUN: 8 mg/dL (ref 6–24)
CHLORIDE: 98 mmol/L (ref 96–106)
CO2: 22 mmol/L (ref 20–29)
CREATININE: 0.86 mg/dL (ref 0.76–1.27)
Calcium: 9 mg/dL (ref 8.7–10.2)
GFR calc Af Amer: 117 mL/min/{1.73_m2} (ref >59)
GFR calc non Af Amer: 101 mL/min/{1.73_m2} (ref >59)
GLUCOSE: 87 mg/dL (ref 65–99)
Globulin, Total: 2.8 g/dL (ref 1.5–4.5)
Potassium: 4.1 mmol/L (ref 3.5–5.2)
Sodium: 135 mmol/L (ref 134–144)
Total Protein: 7.1 g/dL (ref 6.0–8.5)

## 2017-06-29 LAB — CBC
Hematocrit: 36.5 % — ABNORMAL LOW (ref 37.5–51.0)
Hemoglobin: 12.5 g/dL — ABNORMAL LOW (ref 13.0–17.7)
MCH: 32.1 pg (ref 26.6–33.0)
MCHC: 34.2 g/dL (ref 31.5–35.7)
MCV: 94 fL (ref 79–97)
Platelets: 253 10*3/uL (ref 150–379)
RBC: 3.89 x10E6/uL — ABNORMAL LOW (ref 4.14–5.80)
RDW: 13.5 % (ref 12.3–15.4)
WBC: 5.8 10*3/uL (ref 3.4–10.8)

## 2017-06-29 LAB — HEMOGLOBIN A1C
Est. average glucose Bld gHb Est-mCnc: 97 mg/dL
Hgb A1c MFr Bld: 5 % (ref 4.8–5.6)

## 2017-06-29 LAB — LIPID PANEL
CHOLESTEROL TOTAL: 191 mg/dL (ref 100–199)
Chol/HDL Ratio: 2.8 ratio (ref 0.0–5.0)
HDL: 68 mg/dL (ref >39)
LDL Calculated: 95 mg/dL (ref 0–99)
TRIGLYCERIDES: 142 mg/dL (ref 0–149)
VLDL CHOLESTEROL CAL: 28 mg/dL (ref 5–40)

## 2017-06-29 LAB — TSH: TSH: 4.38 u[IU]/mL (ref 0.450–4.500)

## 2017-07-13 ENCOUNTER — Ambulatory Visit: Payer: Self-pay | Admitting: Specialist

## 2017-07-13 DIAGNOSIS — IMO0002 Reserved for concepts with insufficient information to code with codable children: Secondary | ICD-10-CM

## 2017-07-13 NOTE — Progress Notes (Signed)
   Subjective:    Patient ID: Ricardo Butler, male    DOB: [birth year], 52 y.o.   MRN: [id number]  HPI 52 yr old with BIL. Trigger fingers. RF and LF, L > R.    Review of Systems     Objective:   Physical Exam  He has Dupuytren's nodules BIL. R > L. There are not effecting the fingers; he has full extension of the fingers.  Today, there are not triggering.        Assessment & Plan:  Plan: Refer to ortho at [facility] for further treatment.

## 2017-07-26 ENCOUNTER — Ambulatory Visit: Payer: Self-pay | Admitting: Family Medicine

## 2017-07-26 VITALS — BP 128/79 | HR 59 | Temp 97.8°F | Wt 179.8 lb

## 2017-07-26 DIAGNOSIS — F17200 Nicotine dependence, unspecified, uncomplicated: Secondary | ICD-10-CM

## 2017-07-26 DIAGNOSIS — Z09 Encounter for follow-up examination after completed treatment for conditions other than malignant neoplasm: Secondary | ICD-10-CM

## 2017-07-26 DIAGNOSIS — R52 Pain, unspecified: Secondary | ICD-10-CM

## 2017-07-26 MED ORDER — IBUPROFEN 800 MG PO TABS: 800.0000 mg | ORAL_TABLET | Freq: Three times a day (TID) | ORAL | 2 refills | Status: AC | PRN

## 2017-07-26 MED ORDER — MONTELUKAST SODIUM 10 MG PO TABS: 10.0000 mg | ORAL_TABLET | Freq: Every day | ORAL | 2 refills | Status: AC

## 2017-07-26 MED ORDER — OMEPRAZOLE 20 MG PO CPDR: 20.0000 mg | DELAYED_RELEASE_CAPSULE | Freq: Every day | ORAL | 2 refills | Status: AC

## 2017-07-26 NOTE — Progress Notes (Signed)
Patient: Ricardo Butler Male    DOB: 1965/08/11   52 y.o.   MRN: [id number] Visit Date: 07/29/2017  Today's Provider: Kallie Locks, FNP   No chief complaint on file.  Subjective:   HPI Patient is here today for follow up of arthritic pain and stiffness. He states that he has pain in hands, legs, knees, neck, elbows, back. He was diagnosed with Trigger fingers in his right and left forefingers. He experiences more pain in his left forefinger. He states that he takes Motrin to help relieve pain. He states that once he begins moving the pain is better, but it returns after he stops moving around.   He states that he continues to smoke 2 packs of cigarettes daily.  He states that he has cough.   He continues to have dental pain and is scheduled to follow up with dentist at our office. His pain is stable today.  He is accompanied by his wife today.     Previous Medications   BACLOFEN (LIORESAL) 10 MG TABLET    TAKE 1 TABLET BY MOUTH FOUR TIMES DAILY AS NEEDED    Review of Systems  Constitutional: Negative.   HENT: Negative.   Eyes: Negative.   Respiratory: Negative.   Cardiovascular: Negative.   Gastrointestinal: Negative.   Endocrine: Negative.   Genitourinary: Negative.   Musculoskeletal: Positive for arthralgias (Hands, back, neck, elbows, and legs.).       Trigger fingers in L and R forefingers.     Social History   Tobacco Use  . Smoking status: Current Every Day Smoker    Packs/day: 2.00    Years: 40.00    Pack years: 80.00    Types: Cigarettes  . Smokeless tobacco: Never Used  Substance Use Topics  . Alcohol use: Not on file   Objective:   BP 128/79   Pulse (!) 59   Temp 97.8 F (36.6 C)   Wt 179 lb 12.8 oz (81.6 kg)   Physical Exam  Constitutional: He is oriented to person, place, and time. He appears well-developed and well-nourished.  HENT:  Head: Normocephalic and atraumatic.  Right Ear: External ear normal.  Left Ear: External ear normal.  Nose:  Nose normal.  Mouth/Throat: Oropharynx is clear and moist.  Eyes: Pupils are equal, round, and reactive to light. Conjunctivae and EOM are normal.  Neck: Normal range of motion. Neck supple.  Cardiovascular: Normal rate, regular rhythm, normal heart sounds and intact distal pulses.  Pulmonary/Chest: Effort normal and breath sounds normal.  Abdominal: Soft. Bowel sounds are normal.  Musculoskeletal: Normal range of motion.       Arms: Neurological: He is alert and oriented to person, place, and time.  Skin: Skin is warm and dry. Capillary refill takes less than 2 seconds.  Psychiatric: He has a normal mood and affect. His behavior is normal. Judgment and thought content normal.  Nursing note and vitals reviewed.  Assessment & Plan:  1. Pain He pain in worse when he is not moving around. He was recently referred to Orthopedics. He is currently awaiting for [facility]. We will send Rx for Motrin 800 mg to pharmacy and refill Singulair and Prilosec.   2. Tobacco dependence He smokes 2 packs daily. He is not ready for smoking cessation today. He will contact office when ready.   3. Follow up He will follow up in 1 month to reassess pain. He is waiting on [facility] approval.   Meds ordered this encounter  Medications  . ibuprofen (ADVIL,MOTRIN) 800 MG tablet    Sig: Take 1 tablet (800 mg total) by mouth every 8 (eight) hours as needed.    Dispense:  30 tablet    Refill:  2  . montelukast (SINGULAIR) 10 MG tablet    Sig: Take 1 tablet (10 mg total) by mouth daily.    Dispense:  30 tablet    Refill:  2  . omeprazole (PRILOSEC) 20 MG capsule    Sig: Take 1 capsule (20 mg total) by mouth daily.    Dispense:  30 capsule    Refill:  2    Kallie Locks, FNP   [facility]

## 2017-07-29 ENCOUNTER — Encounter: Payer: Self-pay | Admitting: Family Medicine

## 2017-09-01 ENCOUNTER — Ambulatory Visit: Payer: Self-pay

## 2017-09-13 ENCOUNTER — Ambulatory Visit: Payer: Self-pay

## 2017-10-10 ENCOUNTER — Ambulatory Visit: Payer: Self-pay | Admitting: Pharmacy Technician

## 2017-10-10 NOTE — Progress Notes (Signed)
Ricardo Butler, DOB 19-Jul-1965 was a no show for a 9:00 appointment with me on 10/10/17.  I called patient to reschedule.  Patient stated that he has bought a house in [place] and will be moving in about 3 weeks.  He stated that he will no longer be [place], and plans to look for charity care services in [place] or surrounding area.  Patient stated that he meant to call on 10/07/17 to cancel [facility] app[facility] at [facility], because he no longer needs our services.  Sherilyn DacostaBetty J. Jarious Lyon Care Manager [facility]
